# Patient Record
Sex: Female | Born: 1990 | Race: White | Hispanic: No | Marital: Single | Smoking: Never smoker
Health system: Southern US, Community
[De-identification: ages and names within clinical notes are randomized; demographics above are authoritative.]

## PROBLEM LIST (undated history)

## (undated) DIAGNOSIS — J45909 Unspecified asthma, uncomplicated: Secondary | ICD-10-CM

## (undated) DIAGNOSIS — G932 Benign intracranial hypertension: Secondary | ICD-10-CM

## (undated) HISTORY — PX: ADENOIDECTOMY: SUR15

## (undated) HISTORY — PX: BRAIN SURGERY: SHX531

## (undated) HISTORY — PX: TYMPANOSTOMY TUBE PLACEMENT: SHX32

## (undated) HISTORY — PX: TONSILLECTOMY: SUR1361

---

## 2014-01-09 ENCOUNTER — Emergency Department (HOSPITAL_COMMUNITY)
Admission: EM | Admit: 2014-01-09 | Discharge: 2014-01-09 | Disposition: A | Discharge status: Other Acute Inpatient Hospital | Payer: PRIVATE HEALTH INSURANCE | Attending: Emergency Medicine | Admitting: Emergency Medicine

## 2014-01-09 ENCOUNTER — Encounter (HOSPITAL_COMMUNITY): Payer: Self-pay | Admitting: Emergency Medicine

## 2014-01-09 ENCOUNTER — Emergency Department (HOSPITAL_COMMUNITY): Payer: PRIVATE HEALTH INSURANCE

## 2014-01-09 DIAGNOSIS — R6883 Chills (without fever): Secondary | ICD-10-CM | POA: Insufficient documentation

## 2014-01-09 DIAGNOSIS — Z3202 Encounter for pregnancy test, result negative: Secondary | ICD-10-CM | POA: Insufficient documentation

## 2014-01-09 DIAGNOSIS — Q433 Congenital malformations of intestinal fixation: Secondary | ICD-10-CM

## 2014-01-09 DIAGNOSIS — R1031 Right lower quadrant pain: Secondary | ICD-10-CM | POA: Insufficient documentation

## 2014-01-09 HISTORY — DX: Unspecified asthma, uncomplicated: J45.909

## 2014-01-09 LAB — URINE MICROSCOPIC-ADD ON

## 2014-01-09 LAB — URINALYSIS, ROUTINE W REFLEX MICROSCOPIC
Bilirubin Urine: NEGATIVE
Glucose, UA: NEGATIVE mg/dL
HGB URINE DIPSTICK: NEGATIVE
Ketones, ur: NEGATIVE mg/dL
NITRITE: NEGATIVE
PROTEIN: NEGATIVE mg/dL
SPECIFIC GRAVITY, URINE: 1.021 (ref 1.005–1.030)
Urobilinogen, UA: 0.2 mg/dL (ref 0.0–1.0)
pH: 5.5 (ref 5.0–8.0)

## 2014-01-09 LAB — CBC WITH DIFFERENTIAL/PLATELET
Basophils Absolute: 0 10*3/uL (ref 0.0–0.1)
Basophils Relative: 0 % (ref 0–1)
EOS ABS: 0 10*3/uL (ref 0.0–0.7)
EOS PCT: 1 % (ref 0–5)
HEMATOCRIT: 40.1 % (ref 36.0–46.0)
Hemoglobin: 13.5 g/dL (ref 12.0–15.0)
LYMPHS ABS: 2.3 10*3/uL (ref 0.7–4.0)
LYMPHS PCT: 31 % (ref 12–46)
MCH: 31.5 pg (ref 26.0–34.0)
MCHC: 33.7 g/dL (ref 30.0–36.0)
MCV: 93.5 fL (ref 78.0–100.0)
MONO ABS: 0.4 10*3/uL (ref 0.1–1.0)
Monocytes Relative: 6 % (ref 3–12)
Neutro Abs: 4.6 10*3/uL (ref 1.7–7.7)
Neutrophils Relative %: 62 % (ref 43–77)
PLATELETS: 232 10*3/uL (ref 150–400)
RBC: 4.29 MIL/uL (ref 3.87–5.11)
RDW: 13 % (ref 11.5–15.5)
WBC: 7.3 10*3/uL (ref 4.0–10.5)

## 2014-01-09 LAB — COMPREHENSIVE METABOLIC PANEL
ALT: 14 U/L (ref 0–35)
AST: 21 U/L (ref 0–37)
Albumin: 3.9 g/dL (ref 3.5–5.2)
Alkaline Phosphatase: 102 U/L (ref 39–117)
BUN: 11 mg/dL (ref 6–23)
CALCIUM: 9.6 mg/dL (ref 8.4–10.5)
CO2: 21 mEq/L (ref 19–32)
Chloride: 104 mEq/L (ref 96–112)
Creatinine, Ser: 0.74 mg/dL (ref 0.50–1.10)
GLUCOSE: 93 mg/dL (ref 70–99)
Potassium: 4.8 mEq/L (ref 3.7–5.3)
SODIUM: 139 meq/L (ref 137–147)
Total Bilirubin: 0.3 mg/dL (ref 0.3–1.2)
Total Protein: 7.7 g/dL (ref 6.0–8.3)

## 2014-01-09 LAB — LIPASE, BLOOD: Lipase: 18 U/L (ref 11–59)

## 2014-01-09 LAB — WET PREP, GENITAL
CLUE CELLS WET PREP: NONE SEEN
Trich, Wet Prep: NONE SEEN
Yeast Wet Prep HPF POC: NONE SEEN

## 2014-01-09 LAB — POC URINE PREG, ED: Preg Test, Ur: NEGATIVE

## 2014-01-09 MED ORDER — MORPHINE SULFATE 4 MG/ML IJ SOLN
4.0000 mg | Freq: Once | INTRAMUSCULAR | Status: AC
Start: 2014-01-09 — End: 2014-01-09
  Administered 2014-01-09: 4 mg via INTRAVENOUS
  Filled 2014-01-09: qty 1

## 2014-01-09 MED ORDER — IOHEXOL 300 MG/ML  SOLN
100.0000 mL | Freq: Once | INTRAMUSCULAR | Status: AC | PRN
  Administered 2014-01-09: 100 mL via INTRAVENOUS

## 2014-01-09 MED ORDER — IOHEXOL 300 MG/ML  SOLN
25.0000 mL | INTRAMUSCULAR | Status: AC
  Administered 2014-01-09: 25 mL via ORAL

## 2014-01-09 MED ORDER — MORPHINE SULFATE 4 MG/ML IJ SOLN
4.0000 mg | Freq: Once | INTRAMUSCULAR | Status: AC
  Administered 2014-01-09: 4 mg via INTRAVENOUS
  Filled 2014-01-09: qty 1

## 2014-01-09 MED ORDER — ONDANSETRON HCL 4 MG/2ML IJ SOLN
4.0000 mg | Freq: Once | INTRAMUSCULAR | Status: AC
  Administered 2014-01-09: 4 mg via INTRAVENOUS
  Filled 2014-01-09: qty 2

## 2014-01-09 NOTE — ED Notes (Signed)
CT made aware pt finished with contrast,

## 2014-01-09 NOTE — ED Notes (Signed)
Pt reporting 7/10 pain, MD made aware, who reports pt can have tylenol. Pt does not want any morphine. Pt refused tylenol.

## 2014-01-09 NOTE — ED Notes (Signed)
[facility] Felicia E, [employer]  Spoke to patient about primary care resources. Patient states she has a PCP and insurance but did not have her card at the time of check in.

## 2014-01-09 NOTE — ED Notes (Signed)
Pt here at work and developed lower abd pain , most of the pain stays to the right side , no bleeding , no discharge , pt does have some n/v/d

## 2014-01-09 NOTE — Consult Note (Signed)
Reason for Consult: abdominal pain, intestinal malrotation  Referring Physician: Dr. 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  Onset was sudden.  Coarse is unchanged.  Severe in severity.  Characterized as sharp and throbbing pain.  Associated with diarrhea, chills, nausea and vomiting.  She reports right sided abdominal pain and diarrhea intermittently since 2010 which she attributed to IBS.  She denies fevers.  Denies previous history of nausea or vomiting.  Modifying factors include; pain medication which temporarily help alleviate her symptoms.  She denies use of blood thinners.  No past abdominal surgeries.    Past Medical History  Diagnosis Date  . Asthma     Past Surgical History  Procedure Laterality Date  . Tonsillectomy    . Adenoidectomy    . Tympanostomy tube placement      History reviewed. No pertinent family history.  Social History:  reports that she has never smoked. She does not have any smokeless tobacco history on file. She reports that she does not drink alcohol or use illicit drugs.  Allergies:  Allergies  Allergen Reactions  . Latex Itching and Swelling  . Rynatan [Chlorpheniramine-Phenyleph Er] Hives and Swelling    Medications: Scheduled Meds: Continuous Infusions: PRN Meds:.   Results for orders placed during the hospital encounter of 01/09/14 (from the past 48 hour(s))  CBC WITH DIFFERENTIAL     Status: None   Collection Time    01/09/14  8:00 AM      Result Value Ref Range   WBC 7.3  4.0 - 10.5 K/uL   RBC 4.29  3.87 - 5.11 MIL/uL   Hemoglobin 13.5  12.0 - 15.0 g/dL   HCT 40.1  36.0 - 46.0 %   MCV 93.5  78.0 - 100.0 fL   MCH 31.5  26.0 - 34.0 pg   MCHC 33.7  30.0 - 36.0 g/dL   RDW 13.0  11.5 - 15.5 %   Platelets 232  150 - 400 K/uL   Neutrophils Relative % 62  43 - 77  %   Neutro Abs 4.6  1.7 - 7.7 K/uL   Lymphocytes Relative 31  12 - 46 %   Lymphs Abs 2.3  0.7 - 4.0 K/uL   Monocytes Relative 6  3 - 12 %   Monocytes Absolute 0.4  0.1 - 1.0 K/uL   Eosinophils Relative 1  0 - 5 %   Eosinophils Absolute 0.0  0.0 - 0.7 K/uL   Basophils Relative 0  0 - 1 %   Basophils Absolute 0.0  0.0 - 0.1 K/uL  COMPREHENSIVE METABOLIC PANEL     Status: None   Collection Time    01/09/14  8:00 AM      Result Value Ref Range   Sodium 139  137 - 147 mEq/L   Potassium 4.8  3.7 - 5.3 mEq/L   Comment: HEMOLYZED SPECIMEN, RESULTS MAY BE AFFECTED   Chloride 104  96 - 112 mEq/L   CO2 21  19 - 32 mEq/L   Glucose, Bld 93  70 - 99 mg/dL   BUN 11  6 - 23 mg/dL   Creatinine, Ser 0.74  0.50 - 1.10 mg/dL   Calcium 9.6  8.4 - 10.5 mg/dL   Total Protein 7.7  6.0 - 8.3 g/dL   Albumin 3.9  3.5 - 5.2 g/dL   AST 21  0 - 37 U/L   Comment: HEMOLYZED SPECIMEN, RESULTS MAY BE AFFECTED   ALT 14  0 - 35 U/L   Comment: HEMOLYZED SPECIMEN, RESULTS MAY BE AFFECTED   Alkaline Phosphatase 102  39 - 117 U/L   Total Bilirubin 0.3  0.3 - 1.2 mg/dL   GFR calc non Af Amer >90  >90 mL/min   GFR calc Af Amer >90  >90 mL/min   Comment: (NOTE)     The eGFR has been calculated using the CKD EPI equation.     This calculation has not been validated in all clinical situations.     eGFR's persistently <90 mL/min signify possible Chronic Kidney     Disease.  LIPASE, BLOOD     Status: None   Collection Time    01/09/14  8:00 AM      Result Value Ref Range   Lipase 18  11 - 59 U/L  URINALYSIS, ROUTINE W REFLEX MICROSCOPIC     Status: Abnormal   Collection Time    01/09/14  8:15 AM      Result Value Ref Range   Color, Urine YELLOW  YELLOW   APPearance HAZY (*) CLEAR   Specific Gravity, Urine 1.021  1.005 - 1.030   pH 5.5  5.0 - 8.0   Glucose, UA NEGATIVE  NEGATIVE mg/dL   Hgb urine dipstick NEGATIVE  NEGATIVE   Bilirubin Urine NEGATIVE  NEGATIVE   Ketones, ur NEGATIVE  NEGATIVE mg/dL   Protein,  ur NEGATIVE  NEGATIVE mg/dL   Urobilinogen, UA 0.2  0.0 - 1.0 mg/dL   Nitrite NEGATIVE  NEGATIVE   Leukocytes, UA SMALL (*) NEGATIVE  URINE MICROSCOPIC-ADD ON     Status: Abnormal   Collection Time    01/09/14  8:15 AM      Result Value Ref Range   Squamous Epithelial / LPF FEW (*) RARE   WBC, UA 3-6  <3 WBC/hpf   RBC / HPF 0-2  <3 RBC/hpf   Bacteria, UA FEW (*) RARE   Urine-Other MUCOUS PRESENT    WET PREP, GENITAL     Status: Abnormal   Collection Time    01/09/14  8:30 AM      Result Value Ref Range   Yeast Wet Prep HPF POC NONE SEEN  NONE SEEN   Trich, Wet Prep NONE SEEN  NONE SEEN   Clue Cells Wet Prep HPF POC NONE SEEN  NONE SEEN   WBC, Wet Prep HPF POC TOO NUMEROUS TO COUNT (*) NONE SEEN  POC URINE PREG, ED     Status: None   Collection Time    01/09/14  8:35 AM      Result Value Ref Range   Preg Test, Ur NEGATIVE  NEGATIVE   Comment:            THE SENSITIVITY OF THIS     METHODOLOGY IS >24 mIU/mL    Ct Abdomen Pelvis W Contrast  01/09/2014   CLINICAL DATA:  Severe sharp stabbing abdominal pain with vomiting and diarrhea since this morning, history asthma  EXAM: CT ABDOMEN AND PELVIS WITH CONTRAST  TECHNIQUE: Multidetector CT imaging of the abdomen and pelvis was performed using the standard protocol following bolus administration of intravenous contrast. Sagittal and coronal MPR images reconstructed from axial data set.  CONTRAST:  176m OMNIPAQUE IOHEXOL 300 MG/ML SOLN IV. Dilute oral contrast.  COMPARISON:  None  FINDINGS: Lung bases clear.  Liver, spleen, kidneys, and adrenal glands normal appearance.  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  Bowel malrotation identified with proximal small bowel in the RIGHT upper quadrant and majority of colon in the lateral RIGHT abdomen.  No gastric outlet obstruction or wall thickening.  Bowel loops otherwise normal  appearance.  Unremarkable bladder, ureters, uterus, and adnexae.  Appendix not localized in RIGHT abdomen but no pericolic inflammatory changes are seen.  No mass, adenopathy, free fluid, or inflammatory process.  Numerous normal-sized lymph nodes are seen within the mesentery in the RIGHT abdomen.  No acute osseous findings.  IMPRESSION: Abdominal rotation with majority of colon and proximal small bowel located in RIGHT abdomen.  No definite acute intra-abdominal or intrapelvic abnormalities.   Electronically Signed   By: Lavonia Dana M.D.   On: 01/09/2014 10:48    Review of Systems  All other systems reviewed and are negative.  Blood pressure 154/102, pulse 98, temperature 98.1 F (36.7 C), temperature source Oral, resp. rate 18, SpO2 100.00%. Physical Exam  Constitutional: She is oriented to person, place, and time. She appears well-developed and well-nourished. No distress.  HENT:  Head: Normocephalic and atraumatic.  Cardiovascular: Normal rate, regular rhythm, normal heart sounds and intact distal pulses.  Exam reveals no gallop and no friction rub.   No murmur heard. Respiratory: Effort normal and breath sounds normal. No respiratory distress. She has no wheezes. She has no rales. She exhibits no tenderness.  GI: Soft.  Hypoactive bowel sounds, RLQ tenderness, tenderness to RUQ, no rebound tenderness or guarding.   Musculoskeletal: Normal range of motion. She exhibits no edema and no tenderness.  Neurological: She is alert and oriented to person, place, and time.  Skin: Skin is warm and dry. No rash noted. She is not diaphoretic. No erythema. No pallor.  Psychiatric: Judgment and thought content normal.  tearful    Assessment/Plan: Intestinal malrotation: recommend admission for IV hydration pain control and proceeding with surgery in AM due to risk of recurrence of symptoms and complications including bowel obstruction.  It may be that the symptoms she reports over the past 5 years is  related to this as well.  The patient would like to discuss with her mother who works at [employer] and work out [employer] issues.  May also be transferred to [facility] if the patient prefers and if there is surgical availability.   Will follow up with Dr. Doy Mince regarding the patients decision.  Thank you for the interesting consult.    Tatumn Corbridge ANP-BC 01/09/2014, 3:17 PM

## 2014-01-09 NOTE — ED Provider Notes (Signed)
CSN: [id number]     Arrival date & time 01/09/14  [id number] History   First MD Initiated Contact with Patient 01/09/14 0700     Chief Complaint  Patient presents with  . Abdominal Pain     (Consider location/radiation/quality/duration/timing/severity/associated sxs/prior Treatment) Patient is a 23 y.o. female presenting with abdominal pain.  Abdominal Pain Pain location:  RLQ Pain quality: sharp and throbbing   Pain radiates to:  Does not radiate Pain severity:  Severe Onset quality:  Gradual Duration:  3 hours Timing:  Constant Progression:  Worsening Chronicity:  New Context comment:  Began as generalized abdominal pain.  a "stomach ache". Relieved by: lying still. Worsened by:  Movement and palpation Ineffective treatments:  None tried Associated symptoms: chills, diarrhea (x 2), nausea and vomiting (x 1)   Associated symptoms: no dysuria, no fever, no vaginal bleeding and no vaginal discharge     History reviewed. No pertinent past medical history. Past Surgical History  Procedure Laterality Date  . Tonsillectomy    . Adenoidectomy    . Tympanostomy tube placement     History reviewed. No pertinent family history. History  Substance Use Topics  . Smoking status: Never Smoker   . Smokeless tobacco: Not on file  . Alcohol Use: No   OB History   Grav Para Term Preterm Abortions TAB SAB Ect Mult Living                 Review of Systems  Constitutional: Positive for chills. Negative for fever.  Gastrointestinal: Positive for nausea, vomiting (x 1), abdominal pain and diarrhea (x 2).  Genitourinary: Negative for dysuria, vaginal bleeding and vaginal discharge.  All other systems reviewed and are negative.     Allergies  Review of patient's allergies indicates not on file.  Home Medications   Prior to Admission medications   Not on File   BP 157/103  Pulse 105  Temp(Src) 98.1 F (36.7 C) (Oral)  Resp 20  SpO2 97% Physical Exam  Nursing note and vitals  reviewed. Constitutional: She is oriented to person, place, and time. She appears well-developed and well-nourished. No distress.  HENT:  Head: Normocephalic and atraumatic.  Mouth/Throat: Oropharynx is clear and moist.  Eyes: Conjunctivae are normal. Pupils are equal, round, and reactive to light. No scleral icterus.  Neck: Neck supple.  Cardiovascular: Normal rate, regular rhythm, normal heart sounds and intact distal pulses.   No murmur heard. Pulmonary/Chest: Effort normal and breath sounds normal. No stridor. No respiratory distress. She has no rales.  Abdominal: Soft. Bowel sounds are normal. She exhibits no distension. There is tenderness in the right lower quadrant. There is no rigidity, no rebound, no guarding and no CVA tenderness.  Genitourinary: There is no rash or tenderness on the right labia. There is no rash or tenderness on the left labia. Cervix exhibits no motion tenderness, no discharge and no friability. Right adnexum displays no mass. Left adnexum displays no mass. No tenderness around the vagina. No vaginal discharge found.  Exam limited by body habitus.  TTP in RLQ/R pelvis.    Musculoskeletal: Normal range of motion.  Neurological: She is alert and oriented to person, place, and time.  Skin: Skin is warm and dry. No rash noted.  Psychiatric: She has a normal mood and affect. Her behavior is normal.    ED Course  Procedures (including critical care time) Labs Review Labs Reviewed  WET PREP, GENITAL - Abnormal; Notable for the following:    WBC, Wet  Prep HPF POC TOO NUMEROUS TO COUNT (*)    All other components within normal limits  URINALYSIS, ROUTINE W REFLEX MICROSCOPIC - Abnormal; Notable for the following:    APPearance HAZY (*)    Leukocytes, UA SMALL (*)    All other components within normal limits  URINE MICROSCOPIC-ADD ON - Abnormal; Notable for the following:    Squamous Epithelial / LPF FEW (*)    Bacteria, UA FEW (*)    All other components within  normal limits  GC/CHLAMYDIA PROBE AMP  CBC WITH DIFFERENTIAL  COMPREHENSIVE METABOLIC PANEL  LIPASE, BLOOD  POC URINE PREG, ED    Imaging Review Ct Abdomen Pelvis W Contrast  01/09/2014   CLINICAL DATA:  Severe sharp stabbing abdominal pain with vomiting and diarrhea since this morning, history asthma  EXAM: CT ABDOMEN AND PELVIS WITH CONTRAST  TECHNIQUE: Multidetector CT imaging of the abdomen and pelvis was performed using the standard protocol following bolus administration of intravenous contrast. Sagittal and coronal MPR images reconstructed from axial data set.  CONTRAST:  100mL OMNIPAQUE IOHEXOL 300 MG/ML SOLN IV. Dilute oral contrast.  COMPARISON:  None  FINDINGS: Lung bases clear.  Liver, spleen, kidneys, and adrenal glands normal appearance.  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  Bowel malrotation identified with proximal small bowel in the RIGHT upper quadrant and majority of colon in the lateral RIGHT abdomen.  No gastric outlet obstruction or wall thickening.  Bowel loops otherwise normal appearance.  Unremarkable bladder, ureters, uterus, and adnexae.  Appendix not localized in RIGHT abdomen but no pericolic inflammatory changes are seen.  No mass, adenopathy, free fluid, or inflammatory process.  Numerous normal-sized lymph nodes are seen within the mesentery in the RIGHT abdomen.  No acute osseous findings.  IMPRESSION: Abdominal rotation with majority of colon and proximal small bowel located in RIGHT abdomen.  No definite acute intra-abdominal or intrapelvic abnormalities.   Electronically Signed   By: Ulyses SouthwardMark  Boles M.D.   On: 01/09/2014 10:48  All radiology studies independently viewed by me.      EKG Interpretation None      MDM   Final diagnoses:  RLQ abdominal pain  Intestinal malrotation    23 yo female with RLQ abdominal pain.  Initial presentation  concerning for possible appendicitis (onset as generalized crampy pain, localizing to RLQ.)  I don't think exam is consistent with ovarian torsion.  Pain controlled with IV morphine.  CT obtained and shows bowel malrotation.  General Surgery consulted and they recommended admission with operative intervention.  [facility] secondary to insurance reasons.  [facility].  I spoke with Dr. Allayne ButcherMowery (General Surgery) who has accepted in transfer.    Of note, pt refuses transport by EMS.  She understands risks of transfer by POV including deteriorating condition and morbidity/mortality.    Candyce ChurnJohn David Deklyn Gibbon III, MD 01/09/14 [phone]

## 2014-01-09 NOTE — ED Notes (Signed)
Pt was transferred from facility at 1625. Unable to d/c from system at exact time.

## 2014-01-09 NOTE — ED Notes (Signed)
Pelvic cart set up at bedside  

## 2014-01-09 NOTE — Consult Note (Signed)
I have seen and examined the pt and agree with NP-Riebock's progress note. Secondary to the CT findings and patients chronic and acute abd pain I would recommend admission and operation to repair her intestinal malrotation. She wanted to speak with her mother to decide what she wanted to do in regards to staying here or going to an OSH.  I discussed with her the possible risks of : volvulus, ischemic bowel, and death if no repaired.  The patient voiced understanding.

## 2014-01-09 NOTE — ED Notes (Signed)
Patient transported to CT 

## 2014-01-10 DIAGNOSIS — Q433 Congenital malformations of intestinal fixation: Secondary | ICD-10-CM | POA: Insufficient documentation

## 2014-01-10 LAB — GC/CHLAMYDIA PROBE AMP
CT Probe RNA: NEGATIVE
GC Probe RNA: NEGATIVE

## 2014-05-04 DIAGNOSIS — F32A Depression, unspecified: Secondary | ICD-10-CM | POA: Insufficient documentation

## 2014-12-11 DIAGNOSIS — K588 Other irritable bowel syndrome: Secondary | ICD-10-CM | POA: Insufficient documentation

## 2015-04-04 DIAGNOSIS — K219 Gastro-esophageal reflux disease without esophagitis: Secondary | ICD-10-CM | POA: Insufficient documentation

## 2016-04-18 DIAGNOSIS — J343 Hypertrophy of nasal turbinates: Secondary | ICD-10-CM | POA: Insufficient documentation

## 2016-06-16 DIAGNOSIS — M5412 Radiculopathy, cervical region: Secondary | ICD-10-CM | POA: Insufficient documentation

## 2017-01-09 DIAGNOSIS — Q388 Other congenital malformations of pharynx: Secondary | ICD-10-CM | POA: Insufficient documentation

## 2017-07-02 DIAGNOSIS — G932 Benign intracranial hypertension: Secondary | ICD-10-CM | POA: Insufficient documentation

## 2018-02-18 DIAGNOSIS — Z982 Presence of cerebrospinal fluid drainage device: Secondary | ICD-10-CM | POA: Insufficient documentation

## 2018-07-14 DIAGNOSIS — H7291 Unspecified perforation of tympanic membrane, right ear: Secondary | ICD-10-CM | POA: Insufficient documentation

## 2018-08-27 ENCOUNTER — Emergency Department
Admission: EM | Admit: 2018-08-27 | Discharge: 2018-08-27 | Disposition: A | Discharge status: Home / Self Care | Payer: Managed Care, Other (non HMO) | Attending: Emergency Medicine | Admitting: Emergency Medicine

## 2018-08-27 ENCOUNTER — Emergency Department: Payer: Managed Care, Other (non HMO)

## 2018-08-27 DIAGNOSIS — R51 Headache: Secondary | ICD-10-CM | POA: Diagnosis not present

## 2018-08-27 DIAGNOSIS — Z5321 Procedure and treatment not carried out due to patient leaving prior to being seen by health care provider: Secondary | ICD-10-CM | POA: Diagnosis not present

## 2018-08-27 HISTORY — DX: Benign intracranial hypertension: G93.2

## 2018-08-27 NOTE — ED Triage Notes (Signed)
Patient reports hx of migraine Tuesday. Patient c/o headache currently. Patient reports she feels like her head is swollen where her shunt is. Patient reports that she feels like her posterior to her right ear is hot and swollen. 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

## 2018-08-27 NOTE — ED Notes (Signed)
Pt reports that her ride is coming and she needs to leave now; instr to return for any new or worsening

## 2018-08-30 ENCOUNTER — Telehealth: Payer: Self-pay | Admitting: Emergency Medicine

## 2018-08-30 NOTE — Telephone Encounter (Addendum)
Called patient due to lwot to inquire about condition and follow up plans. Left message.  Patient called me back.  Says she did end up talking to dr brown before she left and he went over her scans with her.

## 2019-05-26 DIAGNOSIS — H8110 Benign paroxysmal vertigo, unspecified ear: Secondary | ICD-10-CM | POA: Insufficient documentation

## 2019-09-16 ENCOUNTER — Emergency Department
Admission: EM | Admit: 2019-09-16 | Discharge: 2019-09-16 | Disposition: A | Discharge status: Home / Self Care | Payer: Managed Care, Other (non HMO) | Attending: Emergency Medicine | Admitting: Emergency Medicine

## 2019-09-16 ENCOUNTER — Other Ambulatory Visit: Payer: Self-pay

## 2019-09-16 DIAGNOSIS — Z91040 Latex allergy status: Secondary | ICD-10-CM | POA: Insufficient documentation

## 2019-09-16 DIAGNOSIS — T50Z95A Adverse effect of other vaccines and biological substances, initial encounter: Secondary | ICD-10-CM

## 2019-09-16 DIAGNOSIS — T887XXA Unspecified adverse effect of drug or medicament, initial encounter: Secondary | ICD-10-CM | POA: Diagnosis not present

## 2019-09-16 DIAGNOSIS — J45909 Unspecified asthma, uncomplicated: Secondary | ICD-10-CM | POA: Diagnosis not present

## 2019-09-16 DIAGNOSIS — Y69 Unspecified misadventure during surgical and medical care: Secondary | ICD-10-CM | POA: Diagnosis not present

## 2019-09-16 DIAGNOSIS — Z20822 Contact with and (suspected) exposure to covid-19: Secondary | ICD-10-CM | POA: Insufficient documentation

## 2019-09-16 DIAGNOSIS — T50B95A Adverse effect of other viral vaccines, initial encounter: Secondary | ICD-10-CM | POA: Diagnosis present

## 2019-09-16 MED ORDER — TRIAMCINOLONE ACETONIDE 0.5 % EX OINT: 1.0000 "application " | TOPICAL_OINTMENT | Freq: Two times a day (BID) | CUTANEOUS | 0 refills | Status: AC

## 2019-09-16 MED ORDER — CEPHALEXIN 500 MG PO CAPS: 500.0000 mg | ORAL_CAPSULE | Freq: Four times a day (QID) | ORAL | 0 refills | Status: AC

## 2019-09-16 MED ORDER — DIPHENHYDRAMINE HCL 25 MG PO CAPS: 25.0000 mg | ORAL_CAPSULE | ORAL | 0 refills | Status: AC | PRN

## 2019-09-16 MED ORDER — CEPHALEXIN 500 MG PO CAPS
500.0000 mg | ORAL_CAPSULE | Freq: Once | ORAL | Status: AC
  Administered 2019-09-16: 21:00:00 500 mg via ORAL
  Filled 2019-09-16: qty 1

## 2019-09-16 NOTE — ED Provider Notes (Signed)
Medical screening examination/treatment/procedure(s) were conducted as a shared visit with non-physician practitioner(s) and myself.  I personally evaluated the patient during the encounter.     Based on clinical exam and history I suspect some type of hypersensitivity reaction to Moderna vaccine given how it occured rather immediacy.  She is no longer having any symptoms such as body aches chills or headache, but given the report of the symptoms and her potential exposures to these through work we will test her for coronavirus prior to her returning to work.  Additionally will provide cephalexin in the event that localized inflammation at injection site is from cellulitis, though my examination with a somewhat ringlike edge to the erythematous region suggests most likely hypersensitivity or "drug reaction"     Sharyn Creamer, MD 09/16/19 2119

## 2019-09-16 NOTE — ED Triage Notes (Signed)
Patient had COVID vaccination Tuesday morning. Patient had flu-like symptoms Wednesday. Patient now raised, painful bump at vaccination site.

## 2019-09-16 NOTE — ED Provider Notes (Signed)
[facility] Emergency Department Provider Note  ____________________________________________  Time seen: Approximately 8:55 PM  I have reviewed the triage vital signs and the nursing notes.   HISTORY  Chief Complaint Medication Reaction    HPI Molly Shepherd is a 29 y.o. female that presents to the emergency department for evaluation of reaction to the Moderna covid vaccination that she received last week on Tuesday. The following day, she developed flulike symptoms and had to leave work.  The symptoms have all resolved.  She then also developed a red itchy rash to the location vaccination site. Her whole upper arm itches. Rash and "lump" at the vaccination site have continued to grow.  Patient has a shunt and was concerned for infection so she came to the emergency department.  No shortness of breath, chest pain.   Past Medical History:  Diagnosis Date  . Asthma   . Pseudotumor cerebri     There are no problems to display for this patient.   Past Surgical History:  Procedure Laterality Date  . ADENOIDECTOMY    . BRAIN SURGERY    . TONSILLECTOMY    . TYMPANOSTOMY TUBE PLACEMENT      Prior to Admission medications   Medication Sig Start Date End Date Taking? Authorizing Provider  cephALEXin (KEFLEX) 500 MG capsule Take 1 capsule (500 mg total) by mouth 4 (four) times daily for 10 days. 09/16/19 09/26/19  Enid Derry, PA-C  diphenhydrAMINE (BENADRYL) 25 mg capsule Take 1 capsule (25 mg total) by mouth every 4 (four) hours as needed. 09/16/19 09/15/20  Enid Derry, PA-C  ibuprofen (ADVIL,MOTRIN) 800 MG tablet Take 800 mg by mouth every 8 (eight) hours as needed for headache.    [provider]  medroxyPROGESTERone (DEPO-PROVERA) 150 MG/ML injection Inject 150 mg into the muscle every 3 (three) months.    [provider]  triamcinolone ointment (KENALOG) 0.5 % Apply 1 application topically 2 (two) times daily. 09/16/19   Enid Derry, PA-C     Allergies Diamox [acetazolamide], Topamax [topiramate], Buspar [buspirone], Latex, Rynatan [chlorpheniramine-phenyleph er], and Zoloft [sertraline]  No family history on file.  Social History Social History   Tobacco Use  . Smoking status: Never Smoker  . Smokeless tobacco: Never Used  Substance Use Topics  . Alcohol use: No  . Drug use: No     Review of Systems  Constitutional: No fever/chills Respiratory: No SOB. Gastrointestinal: No nausea, no vomiting.  Musculoskeletal: Negative for musculoskeletal pain. Skin: Negative for abrasions, lacerations, ecchymosis. Positive for rash. Neurological: Negative for headaches   ____________________________________________   PHYSICAL EXAM:  VITAL SIGNS: ED Triage Vitals  Enc Vitals Group     BP 09/16/19 2033 (!) 146/66     Pulse Rate 09/16/19 2032 100     Resp 09/16/19 2032 19     Temp 09/16/19 2032 98.3 F (36.8 C)     Temp Source 09/16/19 2032 Oral     SpO2 09/16/19 2032 97 %     Weight 09/16/19 2030 273 lb 5.9 oz (124 kg)     Height 09/16/19 2030 4\' 11"  (1.499 m)     Head Circumference --      Peak Flow --      Pain Score 09/16/19 2029 2     Pain Loc --      Pain Edu? --      Excl. in GC? --      Constitutional: Alert and oriented. Well appearing and in no acute distress. Eyes: Conjunctivae  are normal. PERRL. EOMI. Head: Atraumatic. ENT:      Ears:      Nose: No congestion/rhinnorhea.      Mouth/Throat: Mucous membranes are moist.  Neck: No stridor.  Cardiovascular: Normal rate, regular rhythm.  Good peripheral circulation. Respiratory: Normal respiratory effort without tachypnea or retractions. Lungs CTAB. Good air entry to the bases with no decreased or absent breath sounds. Musculoskeletal: Full range of motion to all extremities. No gross deformities appreciated. Neurologic:  Normal speech and language. No gross focal neurologic deficits are appreciated.  Skin:  Skin is warm, dry. 2cm by 2cm well  demarcated circular area of erythema to left shoulder. Psychiatric: Mood and affect are normal. Speech and behavior are normal. Patient exhibits appropriate insight and judgement.   ____________________________________________   LABS (all labs ordered are listed, but only abnormal results are displayed)  Labs Reviewed  SARS CORONAVIRUS 2 (TAT 6-24 HRS)   ____________________________________________  EKG   ____________________________________________  RADIOLOGY   No results found.  ____________________________________________    PROCEDURES  Procedure(s) performed:    Procedures    Medications  cephALEXin (KEFLEX) capsule 500 mg (500 mg Oral Given 09/16/19 2126)     ____________________________________________   INITIAL IMPRESSION / ASSESSMENT AND PLAN / ED COURSE  Pertinent labs & imaging results that were available during my care of the patient were reviewed by me and considered in my medical decision making (see chart for details).  Review of the  [place]RS was performed in accordance of the [facility] prior to dispensing any controlled drugs.   Patient's diagnosis is consistent with vaccine reaction.  There is low suspicion of potential cellulitis but patient was given a prescription for Keflex as a precaution.  She will begin Benadryl tonight.  She was also given a prescription for triamcinolone cream to apply to her shoulder.  She will consult with the health department on whether she shall receive the second vaccination.  Patient will be discharged home with prescriptions for Keflex, Benadryl. Patient is to follow up with primary care and health department as directed. Patient is given ED precautions to return to the ED for any worsening or new symptoms.   Molly Shepherd was evaluated in Emergency Department on 09/16/2019 for the symptoms described in the history of present illness. She was evaluated in the context of the global COVID-19 pandemic, which necessitated  consideration that the patient might be at risk for infection with the SARS-CoV-2 virus that causes COVID-19. Institutional protocols and algorithms that pertain to the evaluation of patients at risk for COVID-19 are in a state of rapid change based on information released by regulatory bodies including the CDC and federal and state organizations. These policies and algorithms were followed during the patient's care in the ED.  ____________________________________________  FINAL CLINICAL IMPRESSION(S) / ED DIAGNOSES  Final diagnoses:  Adverse effect of vaccine, initial encounter      NEW MEDICATIONS STARTED DURING THIS VISIT:  ED Discharge Orders         Ordered    triamcinolone ointment (KENALOG) 0.5 %  2 times daily     09/16/19 2138    cephALEXin (KEFLEX) 500 MG capsule  4 times daily     09/16/19 2138    diphenhydrAMINE (BENADRYL) 25 mg capsule  Every 4 hours PRN     09/16/19 2138              This chart was dictated using voice recognition software/Dragon. Despite best efforts to proofread, errors can occur  which can change the meaning. Any change was purely unintentional.    Laban Emperor, PA-C 09/16/19 2316    Delman Kitten, MD 09/17/19 1316

## 2019-09-16 NOTE — Discharge Instructions (Signed)
You are likely having a hypersensitivity reaction to the Covid vaccine.  You can apply the triamcinolone ointment to the rash.  Take Benadryl when you get home and continue this tomorrow.  You can start Keflex tomorrow for the small chance that there is an infection.  Please discuss with the health department whether you should receive the second vaccination.

## 2019-09-17 LAB — SARS CORONAVIRUS 2 (TAT 6-24 HRS): SARS Coronavirus 2: NEGATIVE

## 2019-09-28 ENCOUNTER — Encounter: Payer: Self-pay | Admitting: Family Medicine

## 2019-09-28 NOTE — Progress Notes (Signed)
I reviewed this chart due to the patient receiving vaccination at the [facility] for COVID 19 and to help determine safety of second dose. From documentation it seems like within the first 24 hours the patient had some systemic symptoms which did resolve which is expected after the vaccination. The erythematous and itchy patch has been documented as a side effect of the Moderna vaccine in particular. It is a mild reaction and not severe thus a second dose is not contraindicated.   I recommend the patient take Benadryl 25mg  and Ranitidine 150mg  at least 30 minutes prior to next dose. She will be a 30 minute wait.

## 2019-10-06 ENCOUNTER — Ambulatory Visit: Payer: Managed Care, Other (non HMO) | Admitting: Nurse Practitioner

## 2019-10-06 DIAGNOSIS — R011 Cardiac murmur, unspecified: Secondary | ICD-10-CM | POA: Insufficient documentation

## 2019-10-06 DIAGNOSIS — Z20822 Contact with and (suspected) exposure to covid-19: Secondary | ICD-10-CM | POA: Diagnosis not present

## 2019-10-06 DIAGNOSIS — I340 Nonrheumatic mitral (valve) insufficiency: Secondary | ICD-10-CM | POA: Insufficient documentation

## 2019-10-06 DIAGNOSIS — H9313 Tinnitus, bilateral: Secondary | ICD-10-CM | POA: Insufficient documentation

## 2019-10-06 DIAGNOSIS — H6523 Chronic serous otitis media, bilateral: Secondary | ICD-10-CM | POA: Insufficient documentation

## 2019-10-06 NOTE — Progress Notes (Signed)
Virtual Visit via Telephone Note  I connected with [name] on 10/06/19 at 11:15 AM EST by telephone and verified that I am speaking with the correct person. Location: Patient: Molly Shepherd Provider: Kerrie Buffalo, RN, FNP   I discussed the limitations of telephone visit and need for f/u with PCP to discuss sx and when test results are back to share results with PCP.   History of Present Illness: Molly Shepherd is a 29 y.o. female with hx of morbid obesity,S/P VP shunt,IIH, mitral regurgitation, asthma, IBS, chronic otitis media, who called the [facility] with c/o loss of smell. 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 Last night she experienced a runny nose which she states is not uncommon for her this time of the year. However, when she woke this am and blew her nose she realized she could not smell. Since she is exposed to Covid patient's daily she went this morning for a Covid test. Patient denies any other symptoms although she expresses concern due to her co morbidities.      Observations/Objective: Telehealth visit due to Covid pandemic. Patient denies any sx other than loss of smell this morning and a runny nose last night. Discussed with patient need for her to notify her PCP of sx and of Covid test results when available.   Assessment and Plan: Loss of smell. runny nose,  Exposure to Covid.  Covid test Out of work pending results of Covid test Notify PCP of exposure and symptoms.   Follow Up Instructions: Covid test done this morning and results pending.  Employee encouraged to report symptoms and test results to PCP. Employee with co morbidities that put her at increased risk of complications associated with Covid 19 if her test results are positive.    I discussed the assessment and treatment plan with the patient. The patient was provided an opportunity to ask questions and all were  answered. The patient agreed with the plan and demonstrated an understanding of the instructions.   The patient was advised to call back or seek an in-person evaluation with her PCP if the symptoms worsen or if the condition fails to improve as anticipated.  I provided 10 minutes of non-face-to-face time during this encounter.   Kerrie Buffalo, NP

## 2019-10-07 ENCOUNTER — Other Ambulatory Visit: Payer: Self-pay | Admitting: Occupational Medicine

## 2019-10-10 ENCOUNTER — Ambulatory Visit: Payer: Managed Care, Other (non HMO) | Admitting: Family

## 2019-10-10 ENCOUNTER — Encounter: Payer: Self-pay | Admitting: Family

## 2019-10-10 DIAGNOSIS — R430 Anosmia: Secondary | ICD-10-CM

## 2019-10-10 NOTE — Progress Notes (Signed)
Virtual Visit via Telephone Note  I connected with Molly Shepherd on 10/10/19 at  9:00 AM EST by telephone and verified that I am speaking with the correct person using two identifiers.   I discussed the limitations, risks, security and privacy concerns of performing an evaluation and management service by telephone and the availability of in person appointments. I also discussed with the patient that there may be a patient responsible charge related to this service. The patient expressed understanding and agreed to proceed.   History of Present Illness:   Woke up on Jan 21, with sense of smell decreased.  Was tested for Covid-19, result negative.  Observations/Objective:  Denies any symptoms of Covid-19.  Has a history of asthma, does not require medications. Patient denies symptoms of asthma at present.    Assessment and Plan:   As per policy- Persons with negative Covid-19 test may return to work:  When all symptoms have passed, and 24 hours without fever reducing medications And, 14 days since your last exposure to someone with Covid-19.  Your employer may allow you to return to work sooner- based on company's policy.  Follow Up Instructions:  Call the clinic if you have any worsening symptoms of Covid-19.     I discussed the assessment and treatment plan with the patient. The patient was provided an opportunity to ask questions and all were answered. The patient agreed with the plan and demonstrated an understanding of the instructions.   The patient was advised to call back or seek an in-person evaluation if the symptoms worsen or if the condition fails to improve as anticipated.  I provided 10 minutes of non-face-to-face time during this encounter.   Liliane Channel, NP

## 2019-10-10 NOTE — Progress Notes (Unsigned)
[facility] Employee  [address], 1st [address] [place], [place] [address] Phone:  [phone]    Fax:  [phone]   October 10, 2019  Patient:  Molly Shepherd DOB:  1991/02/15 Date of Visit:  10/10/2019   To Whom it May Concern-  It is my medical opinion that [name] may return to work October 20, 2019.  She tested negative for SARS - CoV2 on October 06, 2019. Mild symptoms.   As per Recommended CDC guidelines-  Negative COVID-19 Test  - All symptoms** resolved  AND  - At least 24 hours have passed since resolution of fever without the use of fever-reducing medications  AND  - 14 days since your last exposure to someone with COVID-19. Your employer may allow you to return to work sooner - see your company's policy regarding this.

## 2019-10-11 ENCOUNTER — Other Ambulatory Visit: Payer: Self-pay

## 2021-01-03 ENCOUNTER — Emergency Department: Payer: No Typology Code available for payment source

## 2021-01-03 ENCOUNTER — Other Ambulatory Visit: Payer: Self-pay

## 2021-01-03 ENCOUNTER — Emergency Department
Admission: EM | Admit: 2021-01-03 | Discharge: 2021-01-03 | Disposition: A | Discharge status: Home / Self Care | Payer: No Typology Code available for payment source | Attending: Emergency Medicine | Admitting: Emergency Medicine

## 2021-01-03 DIAGNOSIS — J45909 Unspecified asthma, uncomplicated: Secondary | ICD-10-CM | POA: Insufficient documentation

## 2021-01-03 DIAGNOSIS — Z86011 Personal history of benign neoplasm of the brain: Secondary | ICD-10-CM | POA: Insufficient documentation

## 2021-01-03 DIAGNOSIS — M25562 Pain in left knee: Secondary | ICD-10-CM | POA: Insufficient documentation

## 2021-01-03 DIAGNOSIS — M25552 Pain in left hip: Secondary | ICD-10-CM | POA: Diagnosis present

## 2021-01-03 DIAGNOSIS — Y9289 Other specified places as the place of occurrence of the external cause: Secondary | ICD-10-CM | POA: Insufficient documentation

## 2021-01-03 DIAGNOSIS — M7918 Myalgia, other site: Secondary | ICD-10-CM

## 2021-01-03 DIAGNOSIS — Z91040 Latex allergy status: Secondary | ICD-10-CM | POA: Diagnosis not present

## 2021-01-03 DIAGNOSIS — W19XXXA Unspecified fall, initial encounter: Secondary | ICD-10-CM | POA: Insufficient documentation

## 2021-01-03 NOTE — ED Notes (Signed)
Workers comp preformed. Pt provided a UDS that was requested by [name]. Urine was walked to the lab and placed in the appropriate place by this tech. Pt given a copy or paperwork for her supervisor and for herself. Pt instructed on what copy is for her and her supervisor.

## 2021-01-03 NOTE — ED Notes (Signed)
Pt informed of need for preg test for xray. Pt informed RN there was no way she was pregnant due to sexual orientation and she would sign form for xray. Xray notified.

## 2021-01-03 NOTE — ED Provider Notes (Signed)
[facility] Emergency Department Provider Note  ____________________________________________   Event Date/Time   First MD Initiated Contact with Patient 01/03/21 0013     (approximate)  I have reviewed the triage vital signs and the nursing notes.   HISTORY  Chief Complaint Fall    HPI Molly Shepherd is a 30 y.o. female who presents for evaluation of left hip and left knee pain after a fall while working.  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  She landed on her left side and has had some pain in her left knee and left hip.  She is ambulatory and able to bear weight but the pain is worse if she moves around.  No lacerations or abrasions noted.  No pain in her back or her neck.  No head trauma, no loss of consciousness.  No chest pain or shortness of breath.         Past Medical History:  Diagnosis Date  . Asthma   . Pseudotumor cerebri     Patient Active Problem List   Diagnosis Date Noted  . Bilateral chronic serous otitis media 10/06/2019  . Heart murmur 10/06/2019  . Tinnitus, bilateral 10/06/2019  . Mitral regurgitation 10/06/2019  . Exposure to COVID-19 virus 10/06/2019  . BPPV (benign paroxysmal positional vertigo) 05/26/2019  . Tympanic membrane perforation, right 07/14/2018  . S/P VP shunt 02/18/2018  . IIH (idiopathic intracranial hypertension) 07/02/2017  . 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 Right cervical radiculopathy 06/16/2016  . Hypertrophy of inferior nasal turbinate 04/18/2016  . GERD without esophagitis 04/04/2015  . Other irritable bowel syndrome 12/11/2014  . Depression 05/04/2014  . 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 Morbid obesity (HCC) 01/09/2014  . Asthma 09/25/2008    Past Surgical History:  Procedure Laterality Date  . ADENOIDECTOMY    . BRAIN SURGERY    . TONSILLECTOMY    . TYMPANOSTOMY TUBE PLACEMENT       Prior to Admission medications   Medication Sig Start Date End Date Taking? Authorizing Provider  diphenhydrAMINE (BENADRYL) 25 mg capsule Take 1 capsule (25 mg total) by mouth every 4 (four) hours as needed. 09/16/19 09/15/20  Enid Derry, PA-C  ibuprofen (ADVIL,MOTRIN) 800 MG tablet Take 800 mg by mouth every 8 (eight) hours as needed for headache.    [provider]  medroxyPROGESTERone (DEPO-PROVERA) 150 MG/ML injection Inject 150 mg into the muscle every 3 (three) months.    [provider]  triamcinolone ointment (KENALOG) 0.5 % Apply 1 application topically 2 (two) times daily. 09/16/19   Enid Derry, PA-C    Allergies Diamox [acetazolamide], Topamax [topiramate], Buspar [buspirone], Latex, Rynatan [chlorpheniramine-phenyleph er], Wellbutrin [bupropion], and Zoloft [sertraline]  No family history on file.  Social History Social History   Tobacco Use  . Smoking status: Never Smoker  . Smokeless tobacco: Never Used  Substance Use Topics  . Alcohol use: No  . Drug use: No    Review of Systems Constitutional: No fever/chills Eyes: No visual changes. Cardiovascular: Denies chest pain. Respiratory: Denies shortness of breath. Gastrointestinal: No abdominal pain.   Musculoskeletal: Left hip and left knee pain after fall as described above. Neurological: Negative for headaches, focal weakness or numbness.   ____________________________________________   PHYSICAL EXAM:  ED Triage Vitals  Enc Vitals Group     BP 01/03/21 0150 (!) 136/92     Pulse Rate 01/03/21 0150 (!) 104  Resp 01/03/21 0150 16     Temp 01/03/21 0150 98.2 F (36.8 C)     Temp Source 01/03/21 0150 Oral     SpO2 01/03/21 0150 100 %     Weight 01/03/21 0010 120.7 kg (266 lb)     Height 01/03/21 0010 1.499 m (4\' 11" )     Head Circumference --      Peak Flow --      Pain Score 01/03/21 0009 7     Pain Loc --      Pain Edu? --      Excl. in GC? --      Constitutional:  Alert and oriented.  Eyes: Conjunctivae are normal.  Head: Atraumatic. Nose: No congestion/rhinnorhea. Mouth/Throat: Patient is wearing a mask. Respiratory: Normal respiratory effort.  No retractions. Gastrointestinal: Soft and nondistended. Musculoskeletal: Patient has some mild pain in her left knee with flexion and extension but no gross abnormalities are noted.  She has some tenderness to palpation of the left hip as well but the tenderness is mild. Neurologic:  Normal speech and language. No gross focal neurologic deficits are appreciated.  Skin:  Skin is warm, dry and intact.   ____________________________________________   LABS (all labs ordered are listed, but only abnormal results are displayed)  Labs Reviewed - No data to display ____________________________________________   RADIOLOGY I, 01/05/21, personally viewed and evaluated these images (plain radiographs) as part of my medical decision making, as well as reviewing the written report by the radiologist.  ED MD interpretation:  No acute abnormalities  Official radiology report(s): DG Hip Unilat W or Wo Pelvis 2-3 Views Left  Result Date: 01/03/2021 CLINICAL DATA:  Fall EXAM: DG HIP (WITH OR WITHOUT PELVIS) 2-3V LEFT COMPARISON:  None. FINDINGS: There is no evidence of hip fracture or dislocation. There is no evidence of arthropathy or other focal bone abnormality. IMPRESSION: Negative. Electronically Signed   By: 01/05/2021 M.D.   On: 01/03/2021 01:43    ____________________________________________   PROCEDURES   Procedure(s) performed (including Critical Care):  Procedures   ____________________________________________   INITIAL IMPRESSION / MDM / ASSESSMENT AND PLAN / ED COURSE  As part of my medical decision making, I reviewed the following data within the electronic MEDICAL RECORD NUMBER Nursing notes reviewed and incorporated, Radiograph reviewed  and reviewed Notes from prior ED  visits   Contusion/musculoskeletal strain.  I personally reviewed the patient's imaging and agree with the radiologist's interpretation that there is no evidence of a fracture or dislocation.  Worker's Comp. paperwork was filed by staff.  Gave my usual and customary recommendations.         ____________________________________________  FINAL CLINICAL IMPRESSION(S) / ED DIAGNOSES  Final diagnoses:  Fall, initial encounter  Musculoskeletal pain     MEDICATIONS GIVEN DURING THIS VISIT:  Medications - No data to display   ED Discharge Orders    None      *Please note:  Molly Shepherd was evaluated in Emergency Department on 01/03/2021 for the symptoms described in the history of present illness. She was evaluated in the context of the global COVID-19 pandemic, which necessitated consideration that the patient might be at risk for infection with the SARS-CoV-2 virus that causes COVID-19. Institutional protocols and algorithms that pertain to the evaluation of patients at risk for COVID-19 are in a state of rapid change based on information released by regulatory bodies including the CDC and federal and state organizations. These policies and algorithms were followed during the  patient's care in the ED.  Some ED evaluations and interventions may be delayed as a result of limited staffing during and after the pandemic.*  Note:  This document was prepared using Dragon voice recognition software and may include unintentional dictation errors.   Loleta Rose, MD 01/03/21 [phone]

## 2021-01-03 NOTE — ED Triage Notes (Signed)
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 Pt reports pain tingled down to left knee. Pain when weight bearing.

## 2021-01-03 NOTE — Discharge Instructions (Signed)
Your exam was reassuring and your x-rays show no sign of any broken bones.  Please take over-the-counter ibuprofen and/or Tylenol according to label instructions.  Follow-up with your regular doctor as needed.

## 2021-11-30 IMAGING — CR DG HIP (WITH OR WITHOUT PELVIS) 2-3V*L*
3 series · 3 of 3 positions shown · non-contrast
Comparison: None.

CLINICAL DATA: Fall

EXAM:
DG HIP (WITH OR WITHOUT PELVIS) 2-3V LEFT

[hip ap]
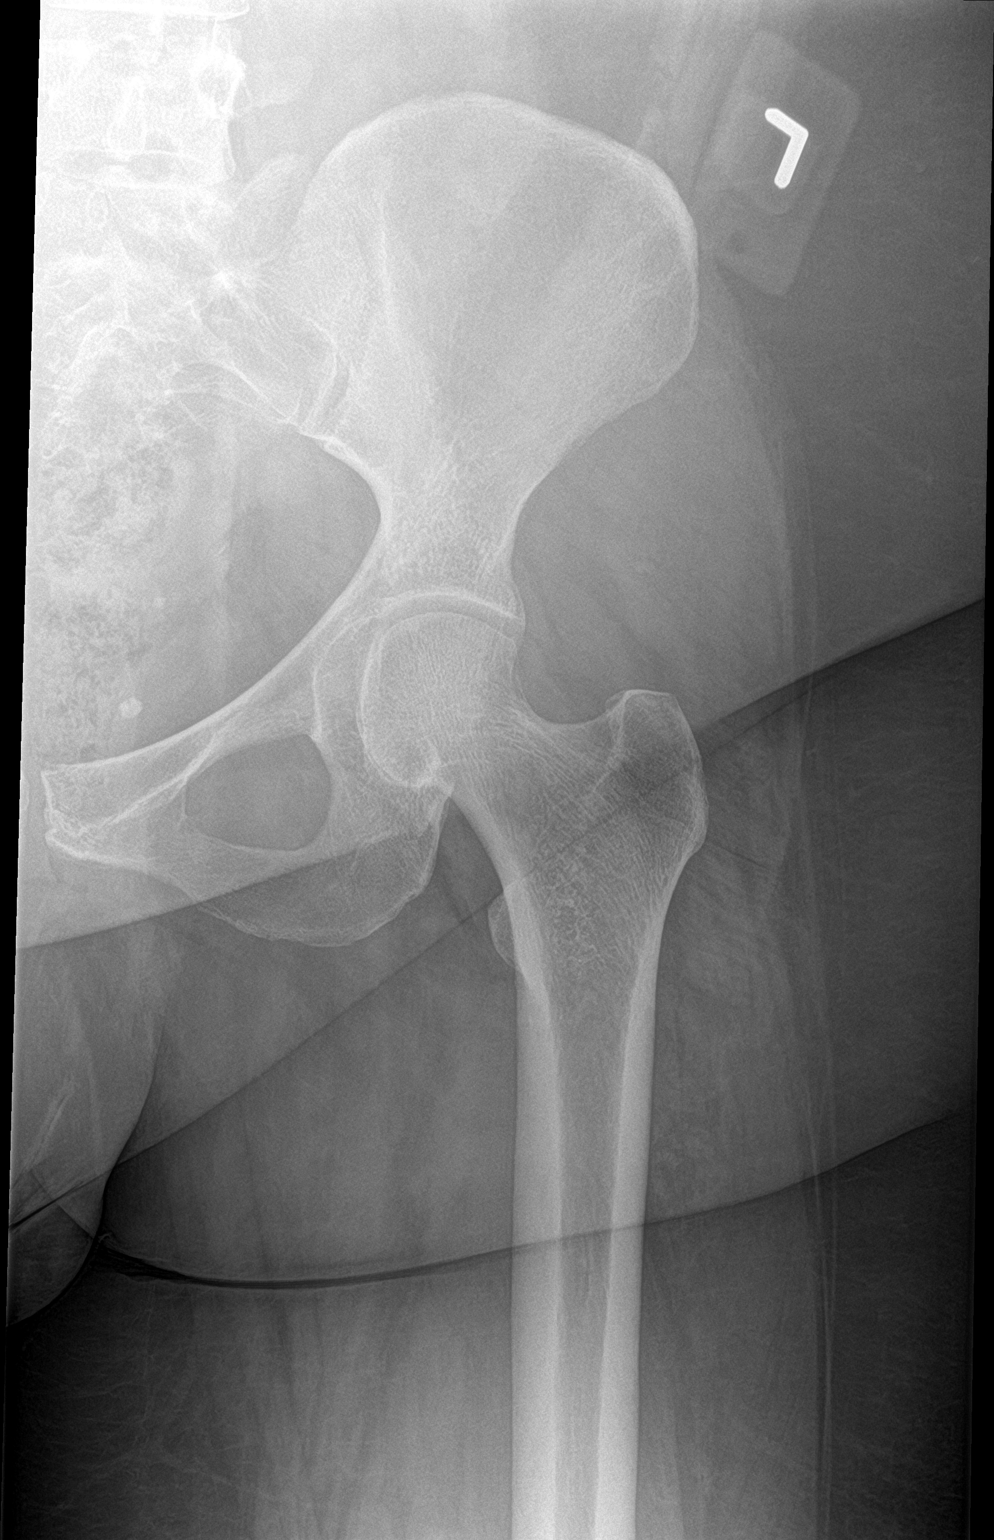

[hip lat]
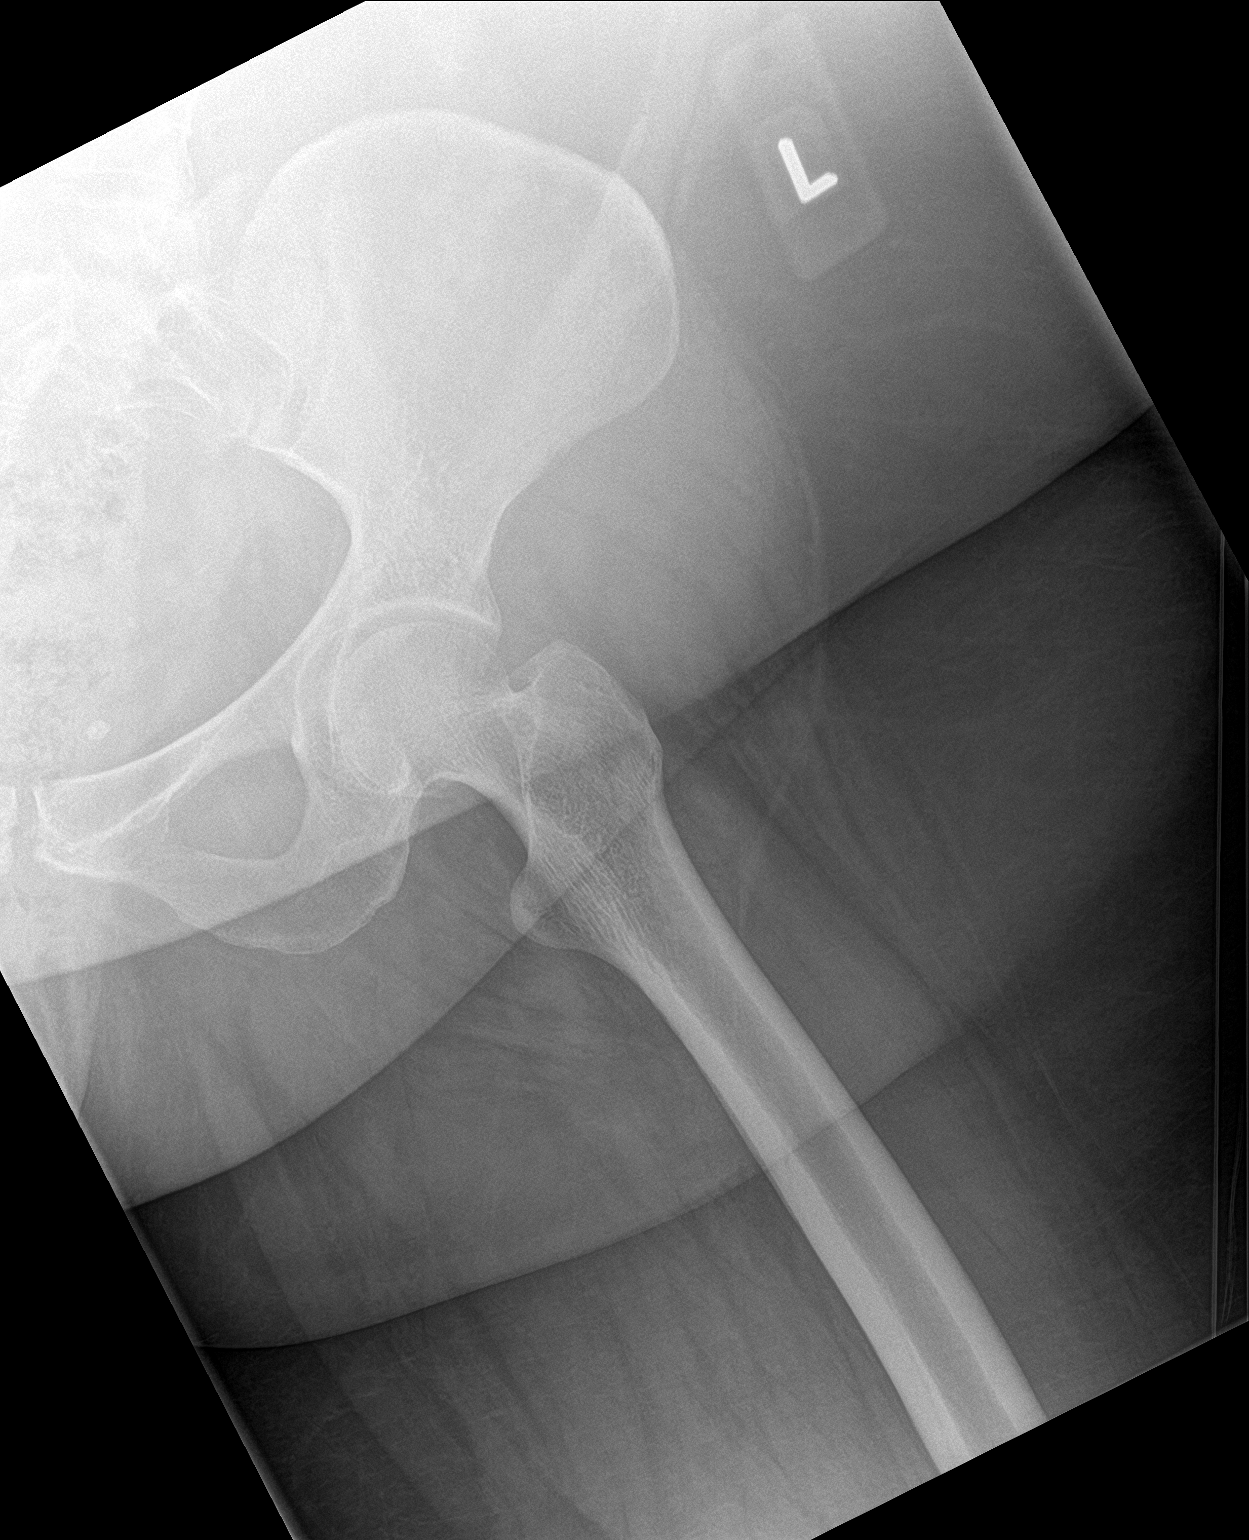

[pelvis ap]
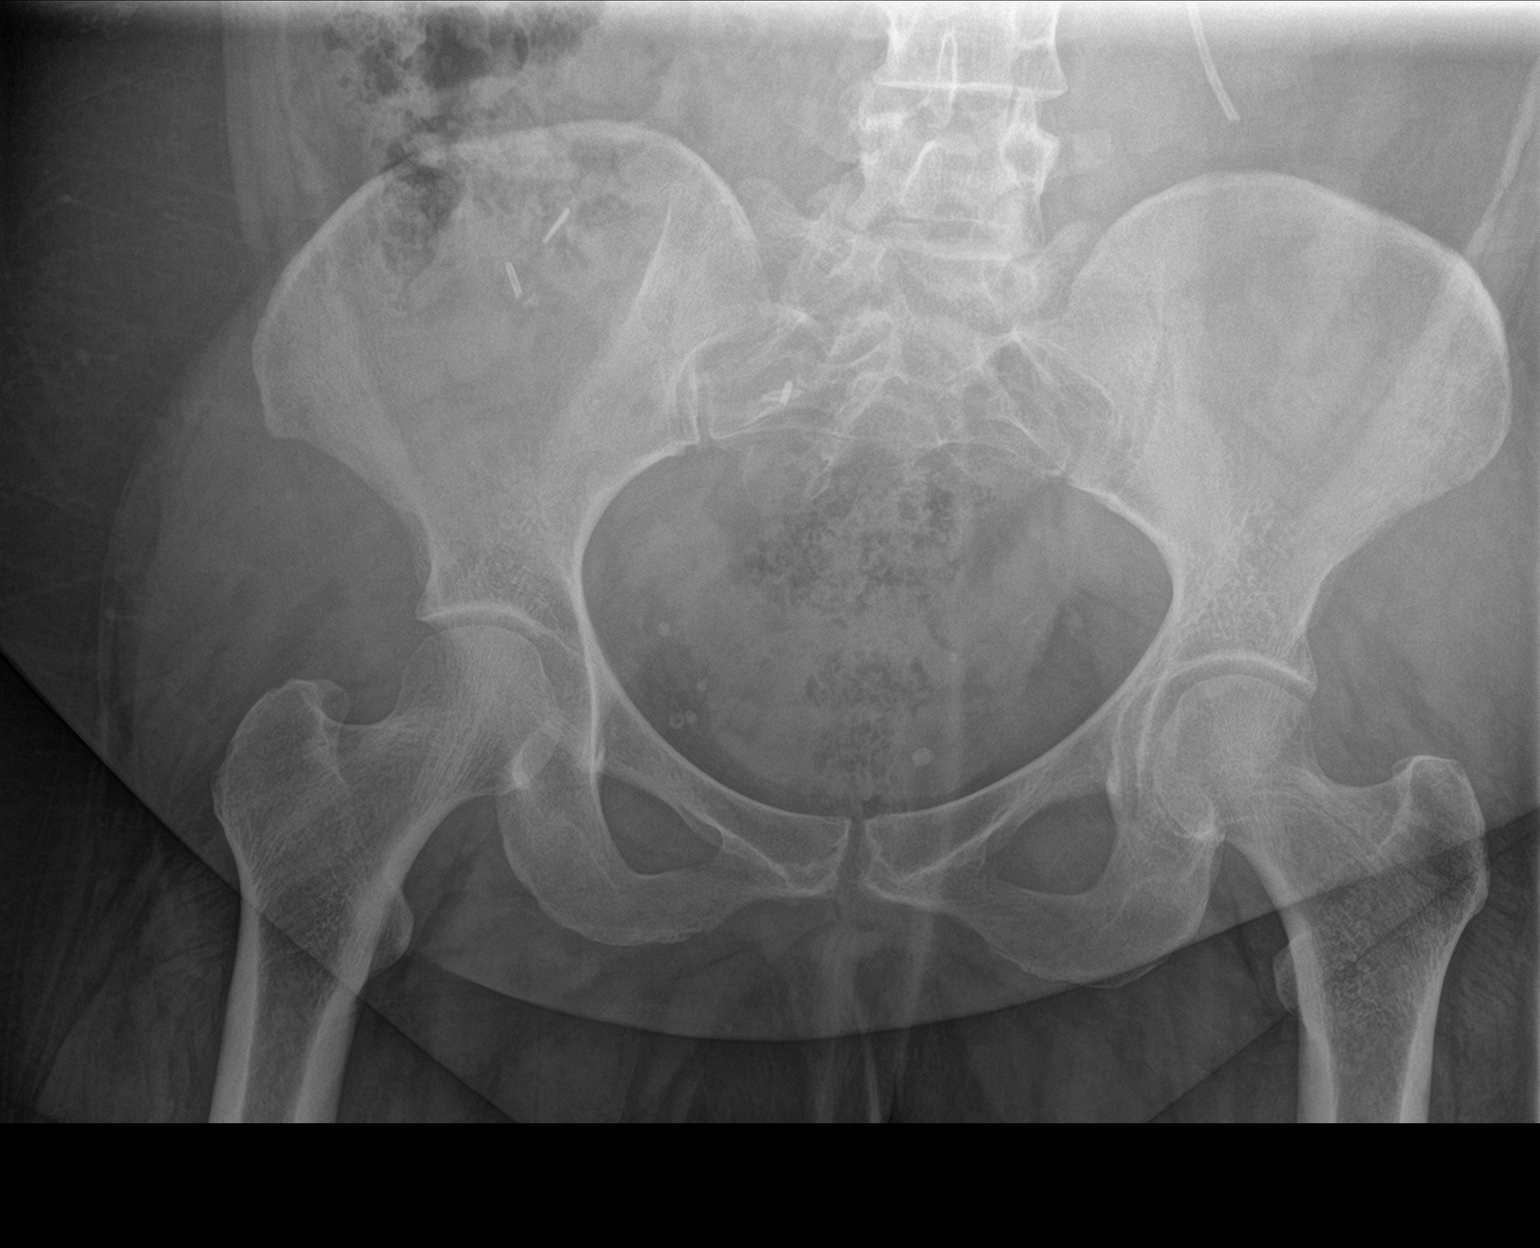

[3 of 3 positions shown; findings below may reference images not displayed]

FINDINGS: There is no evidence of hip fracture or dislocation. There is no
evidence of arthropathy or other focal bone abnormality.
IMPRESSION: Negative.
